# Patient Record
Sex: Male | Born: 1965 | Race: White | Hispanic: No | Marital: Married | Smoking: Former smoker
Health system: Southern US, Community
[De-identification: ages and names within clinical notes are randomized; demographics above are authoritative.]

## PROBLEM LIST (undated history)

## (undated) DIAGNOSIS — Z87442 Personal history of urinary calculi: Secondary | ICD-10-CM

## (undated) DIAGNOSIS — N201 Calculus of ureter: Secondary | ICD-10-CM

## (undated) HISTORY — PX: COLONOSCOPY: SHX174

---

## 2011-06-04 LAB — HEPATIC FUNCTION PANEL
ALT: 69 U/L — AB (ref 10–40)
AST: 46 U/L — AB (ref 14–40)
Bilirubin, Total: 0.8 mg/dL

## 2011-06-04 LAB — BASIC METABOLIC PANEL
BUN: 17 mg/dL (ref 4–21)
Creatinine: 0.9 mg/dL (ref 0.6–1.3)
Glucose: 89 mg/dL
Sodium: 139 mmol/L (ref 137–147)

## 2011-06-04 LAB — LIPID PANEL: HDL: 43 mg/dL (ref 35–70)

## 2011-06-04 LAB — HM COLONOSCOPY: HM Colonoscopy: 2013

## 2011-06-04 LAB — CBC AND DIFFERENTIAL: WBC: 5.4 10^3/mL

## 2011-06-04 LAB — TSH: TSH: 1.16 u[IU]/mL (ref 0.41–5.90)

## 2012-06-04 ENCOUNTER — Encounter: Payer: Self-pay | Admitting: Family Medicine

## 2012-06-08 ENCOUNTER — Encounter: Payer: Self-pay | Admitting: Family Medicine

## 2012-06-08 ENCOUNTER — Ambulatory Visit (INDEPENDENT_AMBULATORY_CARE_PROVIDER_SITE_OTHER): Payer: BC Managed Care – PPO | Admitting: Family Medicine

## 2012-06-08 VITALS — BP 116/80 | HR 64 | Temp 97.3°F | Ht 74.0 in | Wt 301.0 lb

## 2012-06-08 DIAGNOSIS — Z0000 Encounter for general adult medical examination without abnormal findings: Secondary | ICD-10-CM

## 2012-06-08 DIAGNOSIS — R899 Unspecified abnormal finding in specimens from other organs, systems and tissues: Secondary | ICD-10-CM | POA: Insufficient documentation

## 2012-06-08 DIAGNOSIS — E785 Hyperlipidemia, unspecified: Secondary | ICD-10-CM | POA: Insufficient documentation

## 2012-06-08 DIAGNOSIS — B353 Tinea pedis: Secondary | ICD-10-CM

## 2012-06-08 DIAGNOSIS — E559 Vitamin D deficiency, unspecified: Secondary | ICD-10-CM | POA: Insufficient documentation

## 2012-06-08 DIAGNOSIS — Z125 Encounter for screening for malignant neoplasm of prostate: Secondary | ICD-10-CM | POA: Insufficient documentation

## 2012-06-08 LAB — POCT CBC
Granulocyte percent: 61 %G (ref 37–80)
HCT, POC: 46.5 % (ref 43.5–53.7)
Hemoglobin: 15.8 g/dL (ref 14.1–18.1)
Lymph, poc: 1.9 (ref 0.6–3.4)
MCH, POC: 31 pg (ref 27–31.2)
MCHC: 34.1 g/dL (ref 31.8–35.4)
MCV: 91.1 fL (ref 80–97)
MPV: 8.5 fL (ref 0–99.8)
POC Granulocyte: 3.2 (ref 2–6.9)
POC LYMPH PERCENT: 35.1 %L (ref 10–50)
Platelet Count, POC: 166 10*3/uL (ref 142–424)
RBC: 5.1 M/uL (ref 4.69–6.13)
RDW, POC: 12.5 %
WBC: 5.3 10*3/uL (ref 4.6–10.2)

## 2012-06-08 LAB — BASIC METABOLIC PANEL WITH GFR
BUN: 14 mg/dL (ref 6–23)
CO2: 28 mEq/L (ref 19–32)
Calcium: 9.8 mg/dL (ref 8.4–10.5)
Chloride: 104 mEq/L (ref 96–112)
Creat: 1.03 mg/dL (ref 0.50–1.35)
GFR, Est African American: >89 mL/min
GFR, Est Non African American: 85 mL/min
Glucose, Bld: 90 mg/dL (ref 70–99)
Potassium: 4.5 mEq/L (ref 3.5–5.3)
Sodium: 141 mEq/L (ref 135–145)

## 2012-06-08 LAB — HEPATIC FUNCTION PANEL
ALT: 65 U/L — ABNORMAL HIGH (ref 0–53)
AST: 37 U/L (ref 0–37)
Albumin: 4.7 g/dL (ref 3.5–5.2)
Alkaline Phosphatase: 52 U/L (ref 39–117)
Bilirubin, Direct: 0.1 mg/dL (ref 0.0–0.3)
Indirect Bilirubin: 0.6 mg/dL (ref 0.0–0.9)
Total Bilirubin: 0.7 mg/dL (ref 0.3–1.2)
Total Protein: 7.2 g/dL (ref 6.0–8.3)

## 2012-06-08 LAB — PSA: PSA: 0.47 ng/mL (ref <=4.00)

## 2012-06-08 LAB — FERRITIN: Ferritin: 344 ng/mL — ABNORMAL HIGH (ref 22–322)

## 2012-06-08 MED ORDER — KETOCONAZOLE 2 % EX CREA: TOPICAL_CREAM | Freq: Two times a day (BID) | CUTANEOUS | Status: DC

## 2012-06-08 NOTE — Progress Notes (Signed)
Patient ID: Justin Preston, male   DOB: [birth year], 47 y.o.   MRN: [id number] SUBJECTIVE: HPI: Annual exam  Had a kidney stone attack travelling recently.passed it recently fine now. Not sleeping as well.lighter as he has gotten older    PMH/PSH: reviewed/updated in Epic  SH/FH: reviewed/updated in Epic  Allergies: reviewed/updated in Epic  Medications: reviewed/updated in Epic  Immunizations: reviewed/updated in Epic  ROS: As above in the HPI. All other systems are stable or negative.  OBJECTIVE: APPEARANCE:  Patient in no acute distress.The patient appeared well nourished and normally developed. Acyanotic. Waist: VITAL SIGNS:BP 116/80  Pulse 64  Temp(Src) 97.3 F (36.3 C) (Oral)  Ht 6\' 2"  (1.88 m)  Wt 301 lb (136.533 kg)  BMI 38.63 kg/m2   SKIN: warm and  Dry without , tattoos and scars. Rashes on the sole of both feet. Scaly with raised edges.  HEAD and Neck: without JVD, Head and scalp: normal Eyes:No scleral icterus. Fundi normal, eye movements normal. Ears: Auricle normal, canal normal, Tympanic membranes normal, insufflation normal. Nose: normal Throat: normal Neck & thyroid: normal  CHEST & LUNGS: Chest wall: normal Lungs: Clear  CVS: Reveals the PMI to be normally located. Regular rhythm, First and Second Heart sounds are normal,  absence of murmurs, rubs or gallops. Peripheral vasculature: Radial pulses: normal Dorsal pedis pulses: normal Posterior pulses: normal  ABDOMEN:  Appearance:obese Benign,, no organomegaly, no masses, no Abdominal Aortic enlargement. No Guarding , no rebound. No Bruits. Bowel sounds: normal  RECTAL: heme negative brown stool.prostate normal. JY:NWGNFA. No testicular masses  EXTREMETIES: nonedematous. Both Femoral and Pedal pulses are normal.  MUSCULOSKELETAL:  Spine: normal Joints: intact  NEUROLOGIC: oriented to time,place and person; nonfocal. Strength is normal Sensory is normal Reflexes are  normal Cranial Nerves are normal.  ASSESSMENT: Annual physical Dyslipidemia - Plan: Hepatic function panel, NMR Lipoprofile with Lipids  Annual physical exam  Abnormal laboratory test - Plan: BASIC METABOLIC PANEL WITH GFR, POCT CBC, Ferritin  Screening for prostate cancer - Plan: PSA  Unspecified vitamin D deficiency - Plan: Vitamin D 25 hydroxy  Tinea pedis - Plan: ketoconazole (NIZORAL) 2 % cream  PLAN:       HEALTH MAINTENANCE Immunizations: Tetanus-Diphtheria Booster due:2019 Pertusis Booster due:2019 Flu Shot Due: in the fall Pneumonia Vaccine:at 48 years old Herpes Zoster/Shingles Vaccine due: at 60 years HPV due:  Healthy Life Habits: Exercise Goal: 5-6 days/week; start gradually(ie 30 minutes/3days per week) Nutrition: Balanced healthy meals including Vegetables and Fruits. Consider  Reading the following books: 1) Eat to Live by Dr Ottis Stain; 2) Prevent and Reverse Heart Disease by Dr Suzzette Righter.  Vitamins:n/a Aspirin: ? Stop Tobacco Use:n/a Seat Belt Use:++ Sunscreen Use:++ Osteoporosis Prevention: 1) Exercise 2) Calcium/Vitamin D requirements:he [employer] of the [employer] recommends:    Calcium:  800 mg/day for children 85-52 years of age          48 mg/day for children 71-65 years of age          48 mg/day for adults 28-54 years of age          48 mg/day for everyone more than 48 years of age     Vitamin D: 800 IU per day or as prescribed if you are deficient.  Recommended Screening Tests: Colon Cancer Screening:2017 Blood work: today Cholesterol Screening:   today     HIV:        n/a Hepatitis C(people born 1945-1965):n/a  Mammogram: DEXA/Bone Density: GYN  Exam: Monthly Self Breast Exam:  Monthly Self Testicular Exam: ++  Eye Exam:1 to 2 years Dental Health: 6 monthly  Others:    Living Will/Healthcare Power of Attorney:++   Orders Placed This Encounter  Procedures  . CBC and differential     This external order was created through the Results Console.  . Basic metabolic panel    This external order was created through the Results Console.  . Lipid panel    This external order was created through the Results Console.  . Hepatic function panel    This external order was created through the Results Console.  . TSH    This external order was created through the Results Console.  Marland Kitchen BASIC METABOLIC PANEL WITH GFR  . Hepatic function panel  . NMR Lipoprofile with Lipids  . Vitamin D 25 hydroxy  . PSA  . Ferritin  . POCT CBC  . HM COLONOSCOPY    This external order was created through the Results Console.   Results for orders placed in visit on 06/08/12  CBC AND DIFFERENTIAL      Result Value Range   Hemoglobin 16.1  13.5 - 17.5 g/dL   HCT 48  41 - 53 %   WBC 5.4    BASIC METABOLIC PANEL      Result Value Range   Glucose 89     BUN 17  4 - 21 mg/dL   Creatinine 0.9  0.6 - 1.3 mg/dL   Sodium 161  096 - 045 mmol/L  LIPID PANEL      Result Value Range   Triglycerides 169 (*) 40 - 160 mg/dL   Cholesterol 409  0 - 200 mg/dL   HDL 43  35 - 70 mg/dL   LDL Cholesterol 83    HEPATIC FUNCTION PANEL      Result Value Range   Alkaline Phosphatase 44  25 - 125 U/L   ALT 69 (*) 10 - 40 U/L   AST 46 (*) 14 - 40 U/L   Bilirubin, Total 0.8    TSH      Result Value Range   TSH 1.16  0.41 - 5.90 uIU/mL  POCT CBC      Result Value Range   WBC 5.3  4.6 - 10.2 K/uL   Lymph, poc 1.9  0.6 - 3.4   POC LYMPH PERCENT 35.1  10 - 50 %L   POC Granulocyte 3.2  2 - 6.9   Granulocyte percent 61.0  37 - 80 %G   RBC 5.1  4.69 - 6.13 M/uL   Hemoglobin 15.8  14.1 - 18.1 g/dL   HCT, POC 81.1  91.4 - 53.7 %   MCV 91.1  80 - 97 fL   MCH, POC 31.0  27 - 31.2 pg   MCHC 34.1  31.8 - 35.4 g/dL   RDW, POC 78.2     Platelet Count, POC 166.0  142 - 424 K/uL   MPV 8.5  0 - 99.8 fL  HM COLONOSCOPY      Result Value Range   HM Colonoscopy 2013     Meds ordered this encounter  Medications   . ketoconazole (NIZORAL) 2 % cream    Sig: Apply topically 2 (two) times daily.    Dispense:  75 g    Refill:  2   RTC 1 year Diet and exercise and weight loss.  Ngoc Daughtridge P. Modesto Charon, M.D.

## 2012-06-08 NOTE — Patient Instructions (Addendum)
HEALTH MAINTENANCE Immunizations: Tetanus-Diphtheria Booster due:2019 Pertusis Booster due:2019 Flu Shot Due: in the fall Pneumonia Vaccine:at 47 years old Herpes Zoster/Shingles Vaccine due: at 59 years HPV due:  Healthy Life Habits: Exercise Goal: 5-6 days/week; start gradually(ie 30 minutes/3days per week) Nutrition: Balanced healthy meals including Vegetables and Fruits. Consider  Reading the following books: 1) Eat to Live by Dr Ottis Stain; 2) Prevent and Reverse Heart Disease by Dr Suzzette Righter.  Vitamins:n/a Aspirin: ? Stop Tobacco Use:n/a Seat Belt Use:++ Sunscreen Use:++ Osteoporosis Prevention: 1) Exercise 2) Calcium/Vitamin D requirements:he [employer] [facility] the [employer] recommends:    Calcium:  800 mg/day for children 41-41 years of age          48 mg/day for children 60-88 years of age          48 mg/day for adults 56-40 years of age          48 mg/day for everyone more than 48 years of age     Vitamin D: 800 IU per day or as prescribed if you are deficient.  Recommended Screening Tests: Colon Cancer Screening:2017 Blood work: today Cholesterol Screening:   today     HIV:        n/a Hepatitis C(people born 1945-1965):n/a  Mammogram: DEXA/Bone Density: GYN Exam: Monthly Self Breast Exam:  Monthly Self Testicular Exam: ++  Eye Exam:1 to 2 years Dental Health: 6 monthly  Others:    Living Will/Healthcare Power of Attorney:++

## 2012-06-09 LAB — VITAMIN D 25 HYDROXY (VIT D DEFICIENCY, FRACTURES): Vit D, 25-Hydroxy: 25 ng/mL — ABNORMAL LOW (ref 30–89)

## 2012-06-11 LAB — NMR LIPOPROFILE WITH LIPIDS
Cholesterol, Total: 172 mg/dL (ref <200)
HDL Particle Number: 30.4 umol/L — ABNORMAL LOW (ref >=30.5)
HDL Size: 8.3 nm — ABNORMAL LOW (ref >=9.2)
HDL-C: 40 mg/dL (ref >=40)
LDL (calc): 106 mg/dL — ABNORMAL HIGH (ref <100)
LDL Particle Number: 1625 nmol/L — ABNORMAL HIGH (ref <1000)
LDL Size: 20.5 nm — ABNORMAL LOW (ref >20.5)
LP-IR Score: 70 — ABNORMAL HIGH (ref <=45)
Large HDL-P: <1.3 umol/L — ABNORMAL LOW (ref >=4.8)
Large VLDL-P: 1.8 nmol/L (ref <=2.7)
Small LDL Particle Number: 1027 nmol/L — ABNORMAL HIGH (ref <=527)
Triglycerides: 131 mg/dL (ref <150)
VLDL Size: 46.1 nm (ref <=46.6)

## 2014-11-22 ENCOUNTER — Other Ambulatory Visit: Payer: Self-pay | Admitting: Urology

## 2014-12-04 ENCOUNTER — Encounter (HOSPITAL_BASED_OUTPATIENT_CLINIC_OR_DEPARTMENT_OTHER): Payer: Self-pay | Admitting: *Deleted

## 2014-12-27 ENCOUNTER — Encounter (HOSPITAL_BASED_OUTPATIENT_CLINIC_OR_DEPARTMENT_OTHER): Payer: Self-pay | Admitting: *Deleted

## 2014-12-27 NOTE — Progress Notes (Signed)
NPO AFTER MN.  ARRIVE AT 0915.  NEEDS HG AND KUB.

## 2014-12-28 NOTE — Anesthesia Preprocedure Evaluation (Addendum)
Anesthesia Evaluation  Patient identified by MRN, date of birth, ID band Patient awake    Reviewed: Allergy & Precautions, NPO status , Patient's Chart, lab work & pertinent test results  Airway Mallampati: III  TM Distance: >3 FB Neck ROM: Full    Dental no notable dental hx.    Pulmonary former smoker,    Pulmonary exam normal breath sounds clear to auscultation       Cardiovascular negative cardio ROS Normal cardiovascular exam Rhythm:Regular Rate:Normal     Neuro/Psych negative neurological ROS  negative psych ROS   GI/Hepatic negative GI ROS, Neg liver ROS,   Endo/Other  Morbid obesity  Renal/GU negative Renal ROS     Musculoskeletal negative musculoskeletal ROS (+)   Abdominal (+) + obese,   Peds  Hematology negative hematology ROS (+)   Anesthesia Other Findings   Reproductive/Obstetrics                            Anesthesia Physical Anesthesia Plan  ASA: III  Anesthesia Plan: General   Post-op Pain Management:    Induction: Intravenous  Airway Management Planned: LMA  Additional Equipment:   Intra-op Plan:   Post-operative Plan: Extubation in OR  Informed Consent: I have reviewed the patients History and Physical, chart, labs and discussed the procedure including the risks, benefits and alternatives for the proposed anesthesia with the patient or authorized representative who has indicated his/her understanding and acceptance.   Dental advisory given  Plan Discussed with: CRNA  Anesthesia Plan Comments:        Anesthesia Quick Evaluation

## 2014-12-29 ENCOUNTER — Encounter (HOSPITAL_BASED_OUTPATIENT_CLINIC_OR_DEPARTMENT_OTHER): Payer: Self-pay | Admitting: Anesthesiology

## 2014-12-29 ENCOUNTER — Encounter (HOSPITAL_BASED_OUTPATIENT_CLINIC_OR_DEPARTMENT_OTHER)
Admission: RE | Disposition: A | Discharge status: Home / Self Care | Payer: Self-pay | Source: Ambulatory Visit | Attending: Urology

## 2014-12-29 ENCOUNTER — Ambulatory Visit (HOSPITAL_BASED_OUTPATIENT_CLINIC_OR_DEPARTMENT_OTHER): Payer: 59 | Admitting: Anesthesiology

## 2014-12-29 ENCOUNTER — Ambulatory Visit (HOSPITAL_COMMUNITY): Payer: 59

## 2014-12-29 ENCOUNTER — Ambulatory Visit (HOSPITAL_BASED_OUTPATIENT_CLINIC_OR_DEPARTMENT_OTHER)
Admission: RE | Admit: 2014-12-29 | Discharge: 2014-12-29 | Disposition: A | Discharge status: Home / Self Care | Payer: 59 | Source: Ambulatory Visit | Attending: Urology | Admitting: Urology

## 2014-12-29 DIAGNOSIS — Z87442 Personal history of urinary calculi: Secondary | ICD-10-CM | POA: Insufficient documentation

## 2014-12-29 DIAGNOSIS — Z79891 Long term (current) use of opiate analgesic: Secondary | ICD-10-CM | POA: Insufficient documentation

## 2014-12-29 DIAGNOSIS — N201 Calculus of ureter: Secondary | ICD-10-CM | POA: Diagnosis not present

## 2014-12-29 DIAGNOSIS — Z87891 Personal history of nicotine dependence: Secondary | ICD-10-CM | POA: Insufficient documentation

## 2014-12-29 DIAGNOSIS — Z6841 Body Mass Index (BMI) 40.0 and over, adult: Secondary | ICD-10-CM | POA: Insufficient documentation

## 2014-12-29 DIAGNOSIS — Z79899 Other long term (current) drug therapy: Secondary | ICD-10-CM | POA: Diagnosis not present

## 2014-12-29 DIAGNOSIS — N281 Cyst of kidney, acquired: Secondary | ICD-10-CM | POA: Insufficient documentation

## 2014-12-29 HISTORY — PX: HOLMIUM LASER APPLICATION: SHX5852

## 2014-12-29 HISTORY — DX: Personal history of urinary calculi: Z87.442

## 2014-12-29 HISTORY — DX: Calculus of ureter: N20.1

## 2014-12-29 HISTORY — PX: CYSTOSCOPY WITH RETROGRADE PYELOGRAM, URETEROSCOPY AND STENT PLACEMENT: SHX5789

## 2014-12-29 LAB — POCT HEMOGLOBIN-HEMACUE: Hemoglobin: 15.6 g/dL (ref 13.0–17.0)

## 2014-12-29 SURGERY — CYSTOURETEROSCOPY, WITH RETROGRADE PYELOGRAM AND STENT INSERTION
Anesthesia: General | Laterality: Right

## 2014-12-29 MED ORDER — DEXAMETHASONE SODIUM PHOSPHATE 10 MG/ML IJ SOLN
INTRAMUSCULAR | Status: AC
  Filled 2014-12-29: qty 1

## 2014-12-29 MED ORDER — MIDAZOLAM HCL 5 MG/5ML IJ SOLN
INTRAMUSCULAR | Status: DC | PRN
  Administered 2014-12-29: 2 mg via INTRAVENOUS

## 2014-12-29 MED ORDER — CIPROFLOXACIN IN D5W 200 MG/100ML IV SOLN
INTRAVENOUS | Status: AC
  Filled 2014-12-29: qty 100

## 2014-12-29 MED ORDER — CIPROFLOXACIN IN D5W 200 MG/100ML IV SOLN
200.0000 mg | INTRAVENOUS | Status: AC
  Administered 2014-12-29: 200 mg via INTRAVENOUS
  Filled 2014-12-29: qty 100

## 2014-12-29 MED ORDER — ONDANSETRON HCL 4 MG/2ML IJ SOLN
INTRAMUSCULAR | Status: AC
  Filled 2014-12-29: qty 2

## 2014-12-29 MED ORDER — ACETAMINOPHEN 500 MG PO TABS
ORAL_TABLET | ORAL | Status: AC
  Filled 2014-12-29: qty 2

## 2014-12-29 MED ORDER — ACETAMINOPHEN 500 MG PO TABS
1000.0000 mg | ORAL_TABLET | Freq: Once | ORAL | Status: AC
  Administered 2014-12-29: 1000 mg via ORAL
  Filled 2014-12-29: qty 2

## 2014-12-29 MED ORDER — PHENAZOPYRIDINE HCL 100 MG PO TABS
ORAL_TABLET | ORAL | Status: AC
  Filled 2014-12-29: qty 2

## 2014-12-29 MED ORDER — PROPOFOL 10 MG/ML IV BOLUS
INTRAVENOUS | Status: DC | PRN
  Administered 2014-12-29: 320 mg via INTRAVENOUS

## 2014-12-29 MED ORDER — HYDROMORPHONE HCL 1 MG/ML IJ SOLN
0.2500 mg | INTRAMUSCULAR | Status: DC | PRN
  Filled 2014-12-29: qty 1

## 2014-12-29 MED ORDER — BELLADONNA ALKALOIDS-OPIUM 16.2-60 MG RE SUPP
RECTAL | Status: DC | PRN
  Administered 2014-12-29: 1 via RECTAL

## 2014-12-29 MED ORDER — TAMSULOSIN HCL 0.4 MG PO CAPS
ORAL_CAPSULE | ORAL | Status: AC
  Filled 2014-12-29: qty 1

## 2014-12-29 MED ORDER — KETOROLAC TROMETHAMINE 30 MG/ML IJ SOLN
INTRAMUSCULAR | Status: AC
  Filled 2014-12-29: qty 1

## 2014-12-29 MED ORDER — IOHEXOL 350 MG/ML SOLN
INTRAVENOUS | Status: DC | PRN
Start: 2014-12-29 — End: 2014-12-29
  Administered 2014-12-29: 10 mL via URETHRAL

## 2014-12-29 MED ORDER — TAMSULOSIN HCL 0.4 MG PO CAPS: 0.4000 mg | ORAL_CAPSULE | ORAL | Status: AC

## 2014-12-29 MED ORDER — MEPERIDINE HCL 25 MG/ML IJ SOLN
6.2500 mg | INTRAMUSCULAR | Status: DC | PRN
  Filled 2014-12-29: qty 1

## 2014-12-29 MED ORDER — FENTANYL CITRATE (PF) 100 MCG/2ML IJ SOLN
INTRAMUSCULAR | Status: AC
  Filled 2014-12-29: qty 2

## 2014-12-29 MED ORDER — PROMETHAZINE HCL 25 MG/ML IJ SOLN
6.2500 mg | INTRAMUSCULAR | Status: DC | PRN
  Filled 2014-12-29: qty 1

## 2014-12-29 MED ORDER — PHENAZOPYRIDINE HCL 200 MG PO TABS
200.0000 mg | ORAL_TABLET | Freq: Once | ORAL | Status: AC
  Administered 2014-12-29: 200 mg via ORAL
  Filled 2014-12-29: qty 1

## 2014-12-29 MED ORDER — LIDOCAINE HCL (CARDIAC) 20 MG/ML IV SOLN
INTRAVENOUS | Status: DC | PRN
  Administered 2014-12-29: 100 mg via INTRAVENOUS

## 2014-12-29 MED ORDER — ONDANSETRON HCL 4 MG/2ML IJ SOLN
INTRAMUSCULAR | Status: DC | PRN
  Administered 2014-12-29: 4 mg via INTRAVENOUS

## 2014-12-29 MED ORDER — LACTATED RINGERS IV SOLN
INTRAVENOUS | Status: DC
  Administered 2014-12-29: 08:00:00 via INTRAVENOUS
  Filled 2014-12-29: qty 1000

## 2014-12-29 MED ORDER — OXYCODONE HCL 10 MG PO TABS: 10.0000 mg | ORAL_TABLET | ORAL | Status: AC | PRN

## 2014-12-29 MED ORDER — TAMSULOSIN HCL 0.4 MG PO CAPS
0.4000 mg | ORAL_CAPSULE | Freq: Once | ORAL | Status: AC
  Administered 2014-12-29: 0.4 mg via ORAL
  Filled 2014-12-29: qty 1

## 2014-12-29 MED ORDER — KETOROLAC TROMETHAMINE 30 MG/ML IJ SOLN
INTRAMUSCULAR | Status: DC | PRN
  Administered 2014-12-29: 30 mg via INTRAVENOUS

## 2014-12-29 MED ORDER — LIDOCAINE HCL (CARDIAC) 20 MG/ML IV SOLN
INTRAVENOUS | Status: AC
  Filled 2014-12-29: qty 5

## 2014-12-29 MED ORDER — LIDOCAINE HCL 2 % EX GEL
CUTANEOUS | Status: DC | PRN
  Administered 2014-12-29: 1 via URETHRAL

## 2014-12-29 MED ORDER — SODIUM CHLORIDE 0.9 % IR SOLN
Status: DC | PRN
  Administered 2014-12-29: 3000 mL via INTRAVESICAL
  Administered 2014-12-29: 500 mL
  Administered 2014-12-29: 1000 mL via INTRAVESICAL

## 2014-12-29 MED ORDER — PROPOFOL 10 MG/ML IV BOLUS
INTRAVENOUS | Status: AC
  Filled 2014-12-29: qty 40

## 2014-12-29 MED ORDER — DEXAMETHASONE SODIUM PHOSPHATE 4 MG/ML IJ SOLN
INTRAMUSCULAR | Status: DC | PRN
  Administered 2014-12-29: 10 mg via INTRAVENOUS

## 2014-12-29 MED ORDER — MIDAZOLAM HCL 2 MG/2ML IJ SOLN
INTRAMUSCULAR | Status: AC
  Filled 2014-12-29: qty 2

## 2014-12-29 MED ORDER — PHENAZOPYRIDINE HCL 200 MG PO TABS
200.0000 mg | ORAL_TABLET | Freq: Three times a day (TID) | ORAL | Status: AC | PRN
Start: 2014-12-29 — End: ?

## 2014-12-29 MED ORDER — FENTANYL CITRATE (PF) 100 MCG/2ML IJ SOLN
INTRAMUSCULAR | Status: DC | PRN
  Administered 2014-12-29 (×2): 50 ug via INTRAVENOUS

## 2014-12-29 SURGICAL SUPPLY — 40 items
ADAPTER CATH URET PLST 4-6FR (CATHETERS) IMPLANT
BAG DRAIN URO-CYSTO SKYTR STRL (DRAIN) ×2 IMPLANT
BASKET DAKOTA 1.9FR 11X120 (BASKET) ×2 IMPLANT
BASKET LASER NITINOL 1.9FR (BASKET) IMPLANT
BASKET STNLS GEMINI 4WIRE 3FR (BASKET) IMPLANT
BASKET ZERO TIP NITINOL 2.4FR (BASKET) IMPLANT
CANISTER SUCT LVC 12 LTR MEDI- (MISCELLANEOUS) IMPLANT
CATH CLEAR GEL 3F BACKSTOP (CATHETERS) IMPLANT
CATH INTERMIT  6FR 70CM (CATHETERS) ×2 IMPLANT
CATH URET 5FR 28IN CONE TIP (BALLOONS)
CATH URET 5FR 70CM CONE TIP (BALLOONS) IMPLANT
CLOTH BEACON ORANGE TIMEOUT ST (SAFETY) ×2 IMPLANT
ELECT REM PT RETURN 9FT ADLT (ELECTROSURGICAL)
ELECTRODE REM PT RTRN 9FT ADLT (ELECTROSURGICAL) IMPLANT
FIBER LASER FLEXIVA 365 (UROLOGICAL SUPPLIES) IMPLANT
FIBER LASER FLEXIVA 550 (UROLOGICAL SUPPLIES) IMPLANT
FIBER LASER TRAC TIP (UROLOGICAL SUPPLIES) ×2 IMPLANT
GLOVE BIO SURGEON STRL SZ8 (GLOVE) ×2 IMPLANT
GOWN STRL REUS W/ TWL LRG LVL3 (GOWN DISPOSABLE) ×1 IMPLANT
GOWN STRL REUS W/ TWL XL LVL3 (GOWN DISPOSABLE) ×1 IMPLANT
GOWN STRL REUS W/TWL LRG LVL3 (GOWN DISPOSABLE) ×1
GOWN STRL REUS W/TWL XL LVL3 (GOWN DISPOSABLE) ×1
GUIDEWIRE 0.038 PTFE COATED (WIRE) IMPLANT
GUIDEWIRE ANG ZIPWIRE 038X150 (WIRE) IMPLANT
GUIDEWIRE STR DUAL SENSOR (WIRE) ×2 IMPLANT
IV NS IRRIG 3000ML ARTHROMATIC (IV SOLUTION) ×4 IMPLANT
KIT BALLIN UROMAX 15FX10 (LABEL) IMPLANT
KIT BALLN UROMAX 15FX4 (MISCELLANEOUS) IMPLANT
KIT BALLN UROMAX 26 75X4 (MISCELLANEOUS)
KIT ROOM TURNOVER WOR (KITS) ×2 IMPLANT
MANIFOLD NEPTUNE II (INSTRUMENTS) IMPLANT
NS IRRIG 500ML POUR BTL (IV SOLUTION) ×2 IMPLANT
PACK CYSTO (CUSTOM PROCEDURE TRAY) ×2 IMPLANT
SET HIGH PRES BAL DIL (LABEL)
SHEATH ACCESS URETERAL 24CM (SHEATH) ×2 IMPLANT
SHEATH ACCESS URETERAL 38CM (SHEATH) IMPLANT
STENT URET 6FRX26 CONTOUR (STENTS) ×2 IMPLANT
SYRINGE IRR TOOMEY STRL 70CC (SYRINGE) ×2 IMPLANT
TUBE CONNECTING 12X1/4 (SUCTIONS) IMPLANT
WATER STERILE IRR 3000ML UROMA (IV SOLUTION) IMPLANT

## 2014-12-29 NOTE — Anesthesia Postprocedure Evaluation (Signed)
Anesthesia Post Note  Patient: Justin Preston  Procedure(s) Performed: Procedure(s) (LRB): RIGHT RETROGRADE PYELOGRAM, URETEROSCOPE, LASER LITHOTRIPSY  AND STENT PLACEMENT (Right) HOLMIUM LASER APPLICATION (Right)  Patient location during evaluation: PACU Anesthesia Type: General Level of consciousness: sedated and patient cooperative Pain management: pain level controlled Vital Signs Assessment: post-procedure vital signs reviewed and stable Respiratory status: spontaneous breathing Cardiovascular status: stable Anesthetic complications: no    Last Vitals:  Filed Vitals:   12/29/14 1030 12/29/14 1158  BP: 122/89 130/87  Pulse: 63   Temp:  36.7 C  Resp: 13 16    Last Pain: There were no vitals filed for this visit.               Justin Preston

## 2014-12-29 NOTE — Discharge Instructions (Signed)

## 2014-12-29 NOTE — Op Note (Signed)
PATIENT:  Justin Preston  PRE-OPERATIVE DIAGNOSIS:  right Ureteral calculus  POST-OPERATIVE DIAGNOSIS: Same  PROCEDURE:  1. Right retrograde pyelogram with interpretation 2. Right ureteroscopy and laser lithotripsy 3. Right ureteral stone extraction 4. Right double-J stent placement  SURGEON: Garnett FarmMark C Zebedee Segundo, MD  INDICATION: [name] is a 49 year old male with a history of uric acid stones. He was found to have a large stone in his right ureter. Although the stone could not be visualized on plain films he continued to have intermittent discomfort and has not seen the stone pass. He therefore is brought to the operating room today for ureteroscopic management of his stone.  ANESTHESIA:  General  EBL:  Minimal  DRAINS: 6 [place]French, 26 centimeter double-J stent in the right ureter (with string)  SPECIMEN:  Stone given to patient  DESCRIPTION OF PROCEDURE: The patient was taken to the major OR and placed on the table. General anesthesia was administered and then the patient was moved to the dorsal lithotomy position. The genitalia was sterilely prepped and draped. An official timeout was performed.  Initially the 23 French cystoscope with 30 lens was passed under direct vision into the bladder. The bladder was then fully inspected. It was noted be free of any tumors, stones or inflammatory lesions. Ureteral orifices were of normal configuration and position. A 6 French open-ended ureteral catheter was then passed through the cystoscope into the ureteral orifice in order to perform a right retrograde pyelogram.  A retrograde pyelogram was performed by injecting full-strength contrast up the right ureter under direct fluoroscopic control. It revealed a filling defect in the distal right ureter consistent with the stone seen on the previous CT. The remainder of the ureter was noted to be normal as was the intrarenal collecting system. I then passed a 0.038 inch floppy-tipped guidewire through  the open ended catheter and into the area of the renal pelvis and this was left in place. The inner portion of a ureteral access sheath was then passed over the guidewire to gently dilate the intramural ureter. I then proceeded with ureteroscopy.  A 6 French short, rigid ureteroscope was then passed under direct into the bladder and into the right orifice and up the ureter. The stone was identified and I felt it was too large to extract and therefore elected to proceed with laser lithotripsy. The 200  holmium laser fiber was used to fragment the stone. I then used the nitinol basket to extract all of the stone fragments and reinspection of the ureter ureteroscopically revealed no further stone fragments and no injury to the ureter. I then backloaded the cystoscope over the guidewire and passed the stent over the guidewire into the area of the renal pelvis. As the guidewire was removed good curl was noted in the renal pelvis. The bladder was drained and the cystoscope was then removed. I instilled 2% lidocaine jelly in the urethra and applied a penile clamp. The patient was then awakened and taken to the recovery room. The patient tolerated the procedure well no intraoperative complications.  PLAN OF CARE: Discharge to home after PACU  PATIENT DISPOSITION:  PACU - hemodynamically stable.

## 2014-12-29 NOTE — H&P (Signed)
[name] is a 49 year old male with a history of calculus disease.   History of Present Illness Nephrolithiasis: He has passed stones over the years. A CT scan in 10/10 revealed a stone in the lower pole of his right kidney. He developed right flank pain and a CT scan in 5/14 revealed a 4 mm stone at the ureterovesical junction on the right which he passed and a 7 mm stone in the right renal pelvis with Hounsfield units of approximately 500.  Stone analysis in 5/11:100% uric acid.  Serum uric acid: Low  Treatment: Low acid ash diet and was placed on allopurinol by Dr. Vonita MossPeterson.    Left renal cyst: A simple renal cyst noted in the left kidney on both CT scans.    Interval HX: He developed left flank pain and a CT scan done on 11/09/14 revealed a 16 mm long however only 8 x 5 mm wide right proximal ureteral stone with Hounsfield units of 480. He was placed on medical expulsive therapy. He has passed a stone nearly the same size in the past. He has not been having a lot of pain. He told me that he has noted the pain has progressed from the flank to more in the right lower quadrant/groin region. He has not had any hematuria and denies any fever, chills, nausea or vomiting.   Surgical History Problems  1. History of Hand Surgery  Current Meds 1. Oxycodone-Acetaminophen 5-325 MG Oral Tablet; TAKE 1 TO 2 TABLETS EVERY 4 TO 6  HOURS AS NEEDED FOR PAIN;  Therapy: 20Oct2016 to (Last Rx:20Oct2016) Ordered 2. Tamsulosin HCl - 0.4 MG Oral Capsule; TAKE 1 CAPSULE Daily;  Therapy: 20Oct2016 to (Last Rx:20Oct2016)  Requested for: 20Oct2016 Ordered  Allergies Medication  1. No Known Drug Allergies  Family History Problems  1. Family history of Family Health Status - Father's Age   50 2. Family history of Family Health Status - Mother's Age   168 3. Family history of Family Health Status Number Of Children   2 sons 1 daughter 4. No pertinent family history : Mother  Social  History Problems  1. Denied: History of Alcohol Use (History) 2. Caffeine Use   2 coffee per day 3. Marital History - Currently Married 4. Never A Smoker   Review of Systems Genitourinary, constitutional, skin, eye, otolaryngeal, hematologic/lymphatic, cardiovascular, pulmonary, endocrine, musculoskeletal, gastrointestinal, neurological and psychiatric system(s) were reviewed and pertinent findings if present are noted and are otherwise negative.  Gastrointestinal: flank pain and abdominal pain.   Vitals Vital Signs  Height: 6 ft 1 in Weight: 295 lb  BMI Calculated: 38.92 BSA Calculated: 2.54 Blood Pressure: 131 / 84 Heart Rate: 76  Physical Exam Constitutional: Well nourished and well developed. No acute distress.  ENT:. The ears and nose are normal in appearance.  Neck: The appearance of the neck is normal and no neck mass is present.  Pulmonary: No respiratory distress and normal respiratory rhythm and effort.  Cardiovascular: Heart rate and rhythm are normal. No peripheral edema.  Abdomen: The abdomen is soft and nontender. No masses are palpated. No CVA tenderness. No hernias are palpable. No hepatosplenomegaly noted.  Rectal: Rectal exam demonstrates normal sphincter tone, no tenderness and no masses. The prostate has no nodularity and is not tender. The left seminal vesicle is nonpalpable. The right seminal vesicle is nonpalpable. The perineum is normal on inspection.  Genitourinary: Examination of the penis demonstrates no discharge, no masses, no lesions and a normal meatus. The  scrotum is without lesions. The right epididymis is palpably normal and non-tender. The left epididymis is palpably normal and non-tender. The right testis is non-tender and without masses. The left testis is non-tender and without masses.  Lymphatics: The femoral and inguinal nodes are not enlarged or tender.  Skin: Normal skin turgor, no visible rash and no visible skin lesions.  Neuro/Psych:. Mood  and affect are appropriate.   Results/Data Urine  COLOR ORANGE  APPEARANCE CLEAR  SPECIFIC GRAVITY 1.020  pH 5.5  GLUCOSE NEGATIVE  BILIRUBIN NEGATIVE  KETONE NEGATIVE  BLOOD 3+  PROTEIN 1+  NITRITE NEGATIVE  LEUKOCYTE ESTERASE NEGATIVE  SQUAMOUS EPITHELIAL/HPF 0-5 HPF WBC 0-5 WBC/HPF RBC 20-40 RBC/HPF BACTERIA NONE SEEN HPF CRYSTALS NONE SEEN HPF CASTS NONE SEEN LPF Yeast NONE SEEN HPF  The following images/tracing/specimen were independently visualized:  KUB: His stone could not be visualized on KUB today.  The following clinical lab reports were reviewed:  UA: Red cells were noted in the urine but was otherwise clear.    Assessment   We discussed the management of urinary stones. These options include observation, ureteroscopy, shockwave lithotripsy, and PCNL. We discussed which options are relevant to these particular stones. We discussed the natural history of stones as well as the complications of untreated stones and the impact on quality of life without treatment as well as with each of the above listed treatments. We also discussed the efficacy of each treatment in its ability to clear the stone burden. With any of these management options I discussed the signs and symptoms of infection and the need for emergent treatment should these be experienced. For each option we discussed the ability of each procedure to clear the patient of their stone burden.    For observation I described the risks which include but are not limited to silent renal damage, life-threatening infection, need for emergent surgery, failure to pass stone, and pain.    For ureteroscopy I described the risks which include heart attack, stroke, pulmonary embolus, death, bleeding, infection, damage to contiguous structures, positioning injury, ureteral stricture, ureteral avulsion, ureteral injury, need for ureteral stent, inability to perform ureteroscopy, need for an interval procedure, inability to  clear stone burden, stent discomfort and pain.    For shockwave lithotripsy I described the risks which include arrhythmia, kidney contusion, kidney hemorrhage, need for transfusion, long-term risk of diabetes or hypertension, back discomfort, flank ecchymosis, flank abrasion, inability to break up stone, inability to pass stone fragments, Steinstrasse, infection associated with obstructing stones, need for different surgical procedure and possible need for repeat shockwave lithotripsy.    Since the stone cannot be visualized and appears to be uric acid lithotripsy is not an option. He has passed stones of similar size and brought one into show me today. It was pretty large and so he said what he would like to do is schedule for ureteroscopic removal of the stone with the hope of passing the scope in the interim. He will remain on medical expulsive therapy for that reason. He will contact me if the stone passes in the interim.     Plan  1. Continue medical expulsive therapy.  2. He will be tentatively scheduled for right ureteroscopy and laser lithotripsy of his right ureteral stone.

## 2014-12-29 NOTE — Anesthesia Procedure Notes (Signed)
Procedure Name: LMA Insertion Date/Time: 12/29/2014 8:48 AM Performed by: Maris BergerENENNY, Flavius Repsher T Pre-anesthesia Checklist: Patient identified, Emergency Drugs available, Suction available and Patient being monitored Patient Re-evaluated:Patient Re-evaluated prior to inductionOxygen Delivery Method: Circle System Utilized Preoxygenation: Pre-oxygenation with 100% oxygen Intubation Type: IV induction Ventilation: Mask ventilation without difficulty LMA: LMA with gastric port inserted LMA Size: 5.0 Number of attempts: 1 Placement Confirmation: positive ETCO2 Dental Injury: Teeth and Oropharynx as per pre-operative assessment

## 2014-12-29 NOTE — Transfer of Care (Signed)
Immediate Anesthesia Transfer of Care Note  Patient: Justin Preston  Procedure(s) Performed: Procedure(s): RIGHT RETROGRADE PYELOGRAM, URETEROSCOPE, LASER LITHOTRIPSY  AND STENT PLACEMENT (Right) HOLMIUM LASER APPLICATION (Right)  Patient Location: PACU  Anesthesia Type:General  Level of Consciousness: awake, alert  and oriented  Airway & Oxygen Therapy: Patient Spontanous Breathing and Patient connected to nasal cannula oxygen  Post-op Assessment: Report given to RN  Post vital signs: Reviewed and stable  Last Vitals:  Filed Vitals:   12/29/14 0725  BP: 128/86  Pulse: 71  Temp: 36.4 C  Resp: 18    Complications: No apparent anesthesia complications

## 2015-01-01 ENCOUNTER — Encounter (HOSPITAL_BASED_OUTPATIENT_CLINIC_OR_DEPARTMENT_OTHER): Payer: Self-pay | Admitting: Urology

## 2017-06-23 IMAGING — CR DG ABDOMEN 1V
2 series · 2 of 2 positions shown · non-contrast
Comparison: CT 11/09/2014

CLINICAL DATA: RIGHT renal calculus.

EXAM:
ABDOMEN - 1 VIEW

[t abdomen supine (1 of 2)]
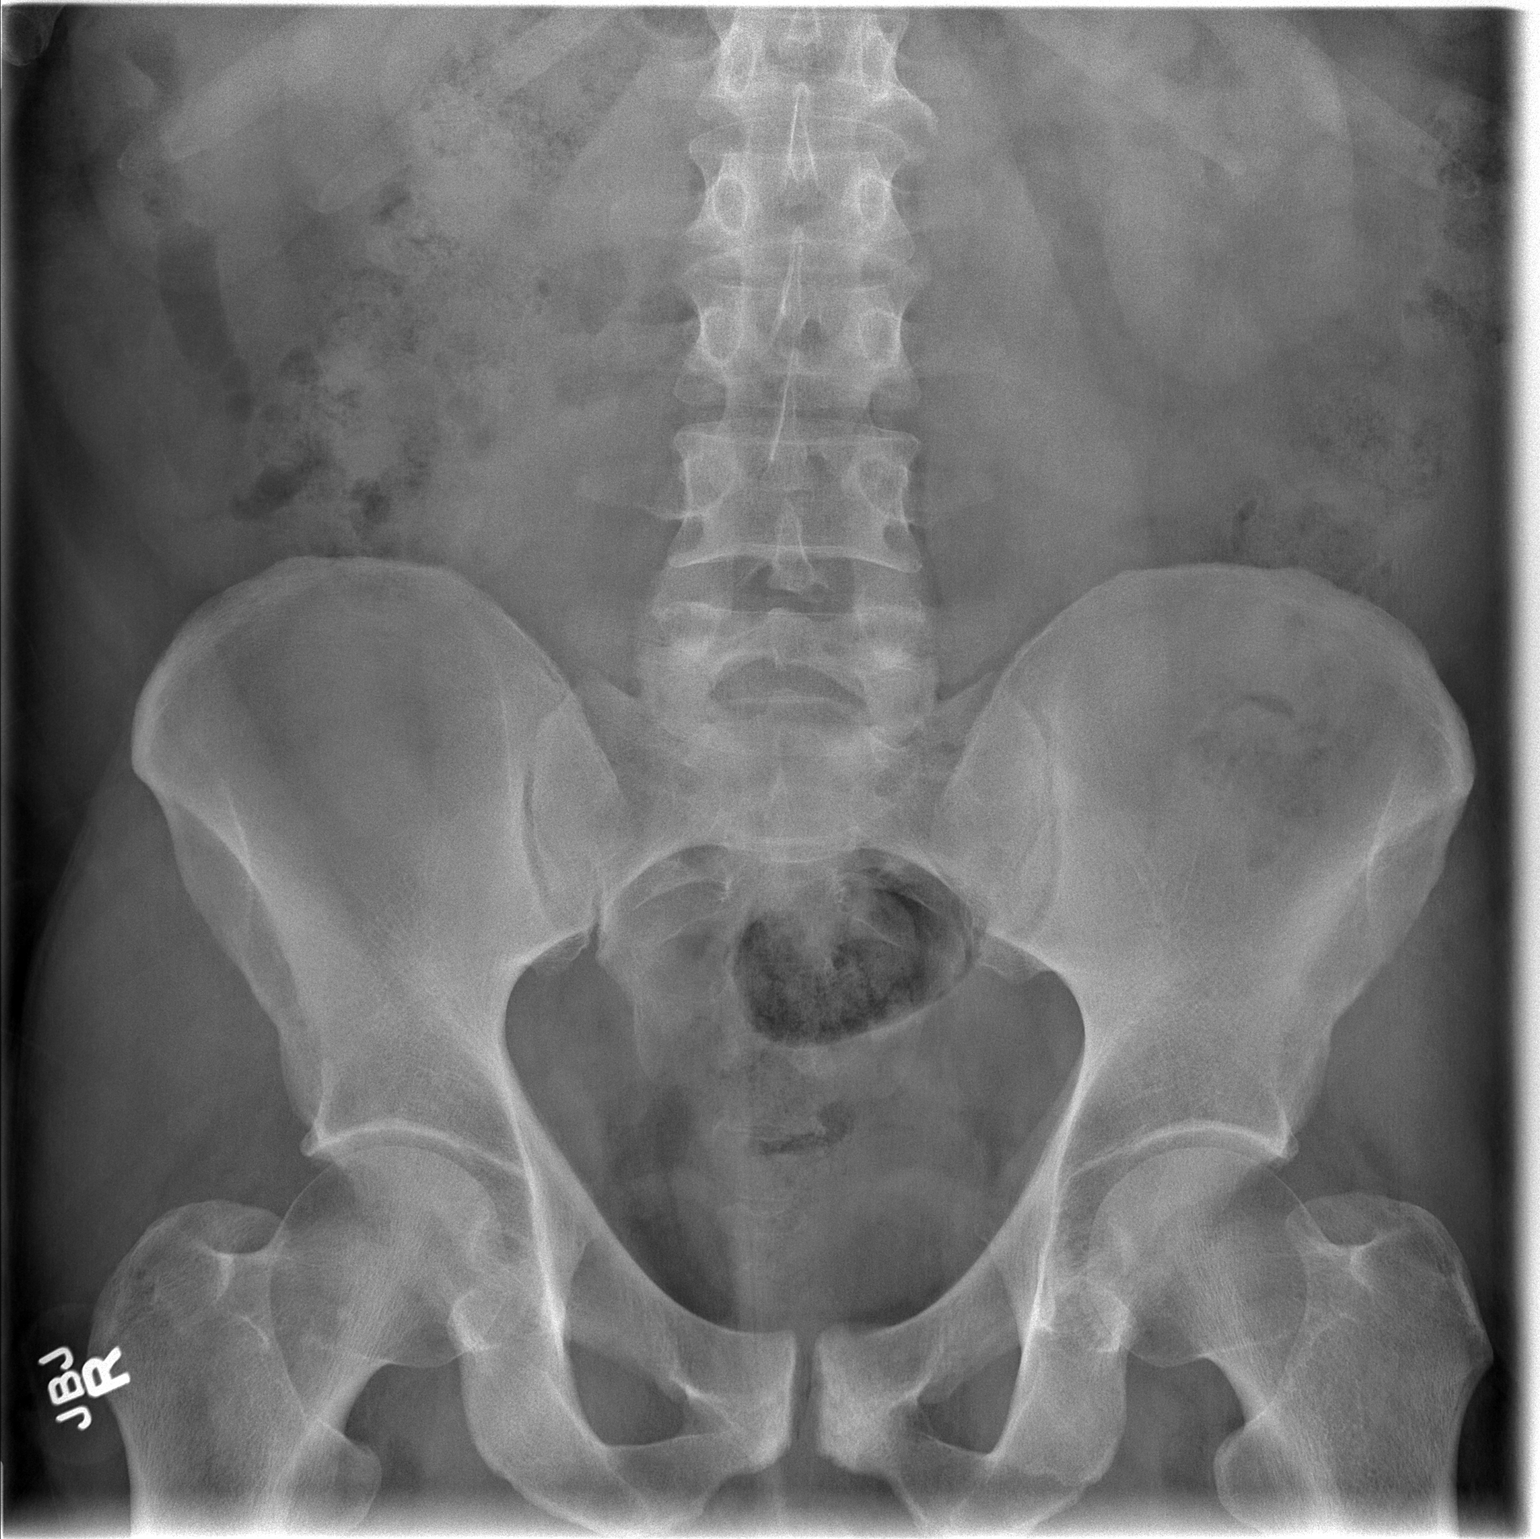

[t abdomen supine (2 of 2)]
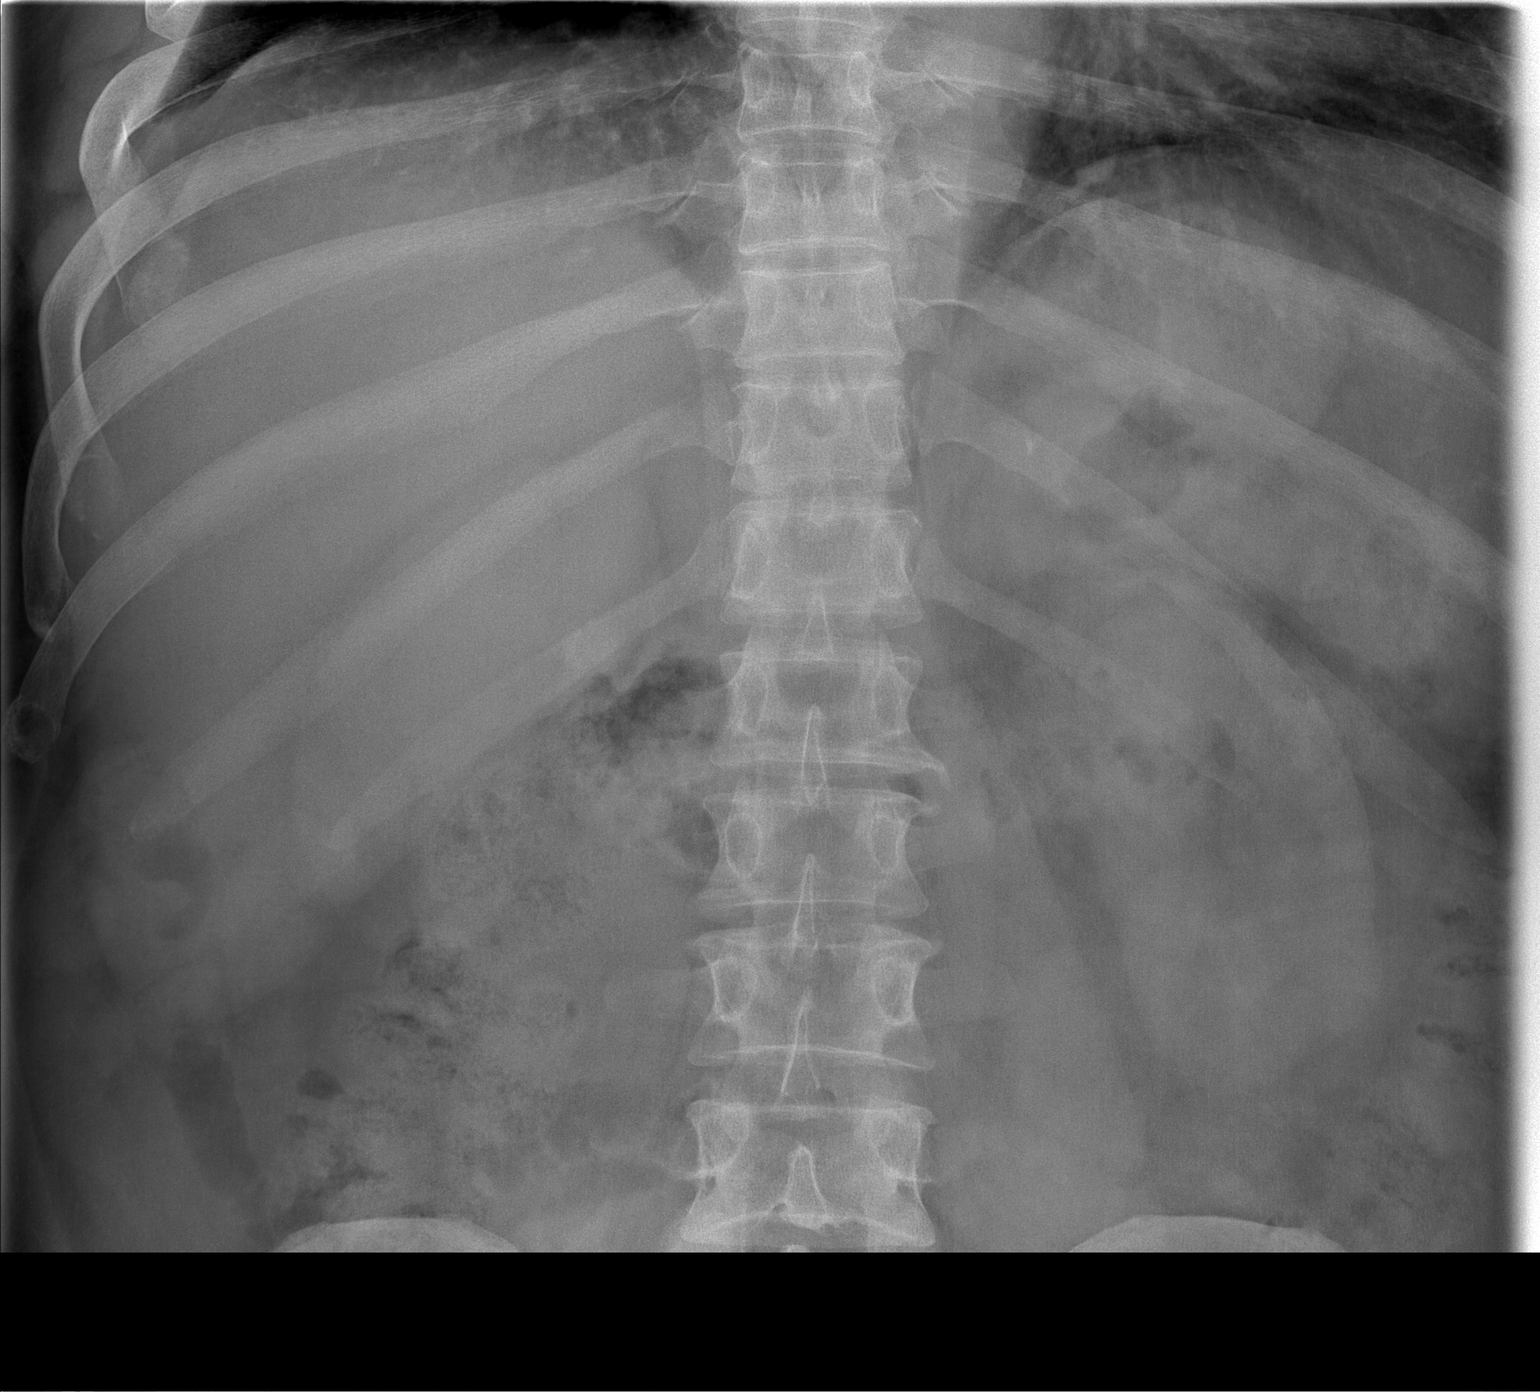

[2 of 2 positions shown; findings below may reference images not displayed]

FINDINGS: No clear renal calculi identified on LEFT or RIGHT. No ureteral
calculi identified. No bladder calculi identified.

12 mm gallstone again noted projecting over the inferior margin
liver.
IMPRESSION: No nephrolithiasis or ureterolithiasis identified by plain film
radiography.

## 2018-09-13 ENCOUNTER — Other Ambulatory Visit: Payer: Self-pay

## 2018-09-13 DIAGNOSIS — Z20822 Contact with and (suspected) exposure to covid-19: Secondary | ICD-10-CM

## 2018-09-14 LAB — NOVEL CORONAVIRUS, NAA: SARS-CoV-2, NAA: NOT DETECTED

## 2018-11-09 ENCOUNTER — Other Ambulatory Visit: Payer: Self-pay

## 2018-11-09 DIAGNOSIS — Z20822 Contact with and (suspected) exposure to covid-19: Secondary | ICD-10-CM

## 2018-11-10 LAB — NOVEL CORONAVIRUS, NAA: SARS-CoV-2, NAA: NOT DETECTED

## 2019-05-05 ENCOUNTER — Ambulatory Visit: Payer: 59 | Attending: Internal Medicine

## 2019-05-05 DIAGNOSIS — Z23 Encounter for immunization: Secondary | ICD-10-CM

## 2019-05-05 NOTE — Progress Notes (Signed)
   Covid-19 Vaccination Clinic  Name:  Justin Preston    MRN: [id number] DOB: 1965-10-18  05/05/2019  [name] was observed post Covid-19 immunization for 15 minutes without incident. He was provided with Vaccine Information Sheet and instruction to access the V-Safe system.   [name] was instructed to call 911 with any severe reactions post vaccine: Marland Kitchen Difficulty breathing  . Swelling of face and throat  . A fast heartbeat  . A bad rash all over body  . Dizziness and weakness   Immunizations Administered    Name Date Dose VIS Date Route   Pfizer COVID-19 Vaccine 05/05/2019  8:19 AM 0.3 mL 12/31/2018 Intramuscular   Manufacturer: [employer], [employer]   Lot: [id number]   NDC: [address]-3

## 2019-05-26 ENCOUNTER — Ambulatory Visit: Payer: 59 | Attending: Internal Medicine

## 2019-05-26 DIAGNOSIS — Z23 Encounter for immunization: Secondary | ICD-10-CM

## 2019-05-26 NOTE — Progress Notes (Signed)
   Covid-19 Vaccination Clinic  Name:  Justin Preston    MRN: [id number] DOB: [birth year]  05/26/2019  [name] was observed post Covid-19 immunization for 15 minutes without incident. He was provided with Vaccine Information Sheet and instruction to access the V-Safe system.   [name] was instructed to call 911 with any severe reactions post vaccine: Marland Kitchen Difficulty breathing  . Swelling of face and throat  . A fast heartbeat  . A bad rash all over body  . Dizziness and weakness   Immunizations Administered    Name Date Dose VIS Date Route   Pfizer COVID-19 Vaccine 05/26/2019  8:17 AM 0.3 mL 03/16/2018 Intramuscular   Manufacturer: [employer]   Lot: [id number]   NDC: [address]-7

## 2019-05-30 ENCOUNTER — Ambulatory Visit: Payer: 59
